# Patient Record
Sex: Male | Born: 1940 | Race: White | Hispanic: No | Marital: Married | Smoking: Former smoker
Health system: Southern US, Community
[De-identification: ages and names within clinical notes are randomized; demographics above are authoritative.]

## PROBLEM LIST (undated history)

## (undated) DIAGNOSIS — I739 Peripheral vascular disease, unspecified: Secondary | ICD-10-CM

## (undated) DIAGNOSIS — E7800 Pure hypercholesterolemia, unspecified: Secondary | ICD-10-CM

## (undated) DIAGNOSIS — M109 Gout, unspecified: Secondary | ICD-10-CM

## (undated) DIAGNOSIS — I10 Essential (primary) hypertension: Secondary | ICD-10-CM

## (undated) DIAGNOSIS — C8590 Non-Hodgkin lymphoma, unspecified, unspecified site: Secondary | ICD-10-CM

## (undated) DIAGNOSIS — M1990 Unspecified osteoarthritis, unspecified site: Secondary | ICD-10-CM

## (undated) HISTORY — PX: INGUINAL HERNIA REPAIR: SHX194

## (undated) HISTORY — PX: TONSILLECTOMY: SUR1361

## (undated) HISTORY — DX: Essential (primary) hypertension: I10

## (undated) HISTORY — PX: COLONOSCOPY: SHX174

## (undated) HISTORY — DX: Non-Hodgkin lymphoma, unspecified, unspecified site: C85.90

## (undated) HISTORY — DX: Gout, unspecified: M10.9

## (undated) HISTORY — DX: Unspecified osteoarthritis, unspecified site: M19.90

## (undated) HISTORY — DX: Peripheral vascular disease, unspecified: I73.9

## (undated) HISTORY — DX: Pure hypercholesterolemia, unspecified: E78.00

---

## 2018-01-25 ENCOUNTER — Ambulatory Visit: Payer: Medicare Other | Admitting: Cardiology

## 2018-03-22 ENCOUNTER — Encounter: Payer: Self-pay | Admitting: Gastroenterology

## 2018-03-22 ENCOUNTER — Other Ambulatory Visit (INDEPENDENT_AMBULATORY_CARE_PROVIDER_SITE_OTHER): Payer: Medicare Other

## 2018-03-22 ENCOUNTER — Telehealth: Payer: Self-pay

## 2018-03-22 ENCOUNTER — Ambulatory Visit (INDEPENDENT_AMBULATORY_CARE_PROVIDER_SITE_OTHER): Payer: Medicare Other | Admitting: Gastroenterology

## 2018-03-22 VITALS — BP 128/76 | HR 71 | Ht 67.0 in | Wt 176.5 lb

## 2018-03-22 DIAGNOSIS — K219 Gastro-esophageal reflux disease without esophagitis: Secondary | ICD-10-CM | POA: Diagnosis not present

## 2018-03-22 DIAGNOSIS — Z800 Family history of malignant neoplasm of digestive organs: Secondary | ICD-10-CM | POA: Diagnosis not present

## 2018-03-22 DIAGNOSIS — R9389 Abnormal findings on diagnostic imaging of other specified body structures: Secondary | ICD-10-CM

## 2018-03-22 DIAGNOSIS — R197 Diarrhea, unspecified: Secondary | ICD-10-CM

## 2018-03-22 NOTE — Progress Notes (Signed)
Chief Complaint:   Referring Provider:  Imagene Riches, NP      ASSESSMENT AND PLAN;   #1. Change in Bowel Habits (postprandial diarrhea) #2. FH of colon cancer (dad at age 32s). Neg colon 2010. #3. GERD. #4. H/O NHL at age 78. #5. Abn US showing cholelithiasis and borderline dilated CBD  Plan: - Check CBC, CMP and lipase today. - GI pathogen, WBC, fat and elastace - CT abdo/pelvis, may need MRCP if labs are abnormal or if CT shows any biliary dilatation. - I have also recommended EGD and colonoscopy.  However, patient and patient's wife would like to hold off at the present time.  They do understand that there is small but definite risks of missing neoplasms.   - If still with problems, will give him a trial of Bentyl. - Follow-up in 6 weeks once above blood tests are all back.  We will discuss more regarding endoscopic procedures at follow-up visit.    HPI:    Kenneth Pollard is a 78 y.o. male  Seen at request of Heide Scales Abnormal ultrasound showing cholelithiasis with mild CBD dilatation 7 mm.  No nausea, vomiting, heartburn (has been taking Nexium), regurgitation, odynophagia or dysphagia.  No significant diarrhea or constipation.  There is no melena or hematochezia. No unintentional weight loss.  No abdominal pain.  Has been having softer bowel movements/diarrhea ever since he moved to [place] from [place], [place] 6 months ago.  Has had colonoscopy over 10 years ago which was negative.  Had Cologuard 5 years ago which was negative per patient.  Diarrhea-softer bowel movements, associated abdominal bloating, has gained weight, could not identify any definite exacerbating factors, no intolerance to milk or cheese.  No jaundice dark urine or pale stools.  No history of itching.   We just got the lab results from [facility] office-from 01/27/2018 TB 1.6, direct 0.3, hemoglobin 17.8, platelets 172K AST 22, ALT 20, alk phos 47 albumin 4.7. Previously TSH normal,  HbA1c 5.7   Past Medical History:  Diagnosis Date  . Arthritis   . Elevated cholesterol   . Gout   . Hypertension   . Non Hodgkin's lymphoma ([place])   . PAD (peripheral artery disease) ([place])     Past Surgical History:  Procedure Laterality Date  . COLONOSCOPY     around 2010  . INGUINAL HERNIA REPAIR     at age 78  . TONSILLECTOMY      Family History  Problem Relation Age of Onset  . Colon cancer Father   . Esophageal cancer Neg Hx   . Prostate cancer Neg Hx     Social History   Tobacco Use  . Smoking status: Former Research scientist (life sciences)  . Smokeless tobacco: Never Used  Substance Use Topics  . Alcohol use: Yes    Comment: couple of times a week  . Drug use: Never    Current Outpatient Medications  Medication Sig Dispense Refill  . amLODipine (NORVASC) 5 MG tablet 1 tablet daily.    Marland Kitchen atorvastatin (LIPITOR) 10 MG tablet 1 tablet daily.    . benzonatate (TESSALON PERLES) 100 MG capsule Take 100 mg by mouth as needed for cough.    . cilostazol (PLETAL) 100 MG tablet 1 tablet 2 (two) times daily.    Marland Kitchen esomeprazole (NEXIUM) 40 MG capsule 1 capsule daily.    . fluticasone (FLONASE) 50 MCG/ACT nasal spray daily.    Marland Kitchen guaiFENesin (MUCINEX) 600 MG 12 hr tablet 1 tablet daily.    Marland Kitchen  hydrochlorothiazide (HYDRODIURIL) 25 MG tablet 1 tablet daily.    . meloxicam (MOBIC) 15 MG tablet 1 tablet daily.    . metoprolol (TOPROL XL) 200 MG 24 hr tablet 1 tablet daily.     No current facility-administered medications for this visit.     Allergies  Allergen Reactions  . Pneumococcal Vaccine Other (See Comments)  . Codeine Rash    Review of Systems:  Constitutional: Denies fever, chills, diaphoresis, appetite change and fatigue.  HEENT: Denies photophobia, eye pain, redness, hearing loss, ear pain, congestion, sore throat, rhinorrhea, sneezing, mouth sores, neck pain, neck stiffness and tinnitus.   Respiratory: Denies SOB, DOE, cough, chest tightness,  and wheezing.   Cardiovascular: Denies  chest pain, palpitations and leg swelling.  Genitourinary: Denies dysuria, urgency, frequency, hematuria, flank pain and difficulty urinating.  Musculoskeletal: Denies myalgias, back pain, joint swelling, arthralgias and gait problem.  Skin: No rash.  Neurological: Denies dizziness, seizures, syncope, weakness, light-headedness, numbness and headaches.  Hematological: Denies adenopathy. Easy bruising, personal or family bleeding history  Psychiatric/Behavioral: No anxiety or depression     Physical Exam:    Ht 5' 7" (1.702 m)   Wt 176 lb 8 oz (80.1 kg)   BMI 27.64 kg/m  Filed Weights   03/22/18 1442  Weight: 176 lb 8 oz (80.1 kg)   Constitutional:  Well-developed, in no acute distress. Psychiatric: Normal mood and affect. Behavior is normal. HEENT: Pupils normal.  Conjunctivae are normal. No scleral icterus. Neck supple.  Cardiovascular: Normal rate, regular rhythm. No edema Pulmonary/chest: Effort normal and breath sounds normal. No wheezing, rales or rhonchi. Abdominal: Soft, nondistended. Nontender. Bowel sounds active throughout. There are no masses palpable. No hepatomegaly. Rectal:  defered Neurological: Alert and oriented to person place and time. Skin: Skin is warm and dry. No rashes noted.    Carmell Austria, MD 03/22/2018, 3:04 PM  Cc: Imagene Riches, NP

## 2018-03-22 NOTE — Telephone Encounter (Signed)
Called and left voicemail on patient's home number to give [place] a call to set up his 6 week follow up appointment with Dr Lyndel Safe in March. Left our number as [phone] and told him to call in call in march to set appointment up and if he had questions then to call us

## 2018-03-22 NOTE — Patient Instructions (Addendum)
If you are age 78 or older, your body mass index should be between 23-30. Your Body mass index is 27.64 kg/m. If this is out of the aforementioned range listed, please consider follow up with your Primary Care Provider.  If you are age 85 or younger, your body mass index should be between 19-25. Your Body mass index is 27.64 kg/m. If this is out of the aformentioned range listed, please consider follow up with your Primary Care Provider.   Call us back at [phone] if you decide you would like to proceed with you Endoscopy and Colonoscopy.  Please go to the lab on the 2nd floor [address] before you leave the office today.   You [facility] CT [facility] [place] [place] [address] [address]   You are scheduled on 03/28/2018 at 10:30am. You should arrive 15 minutes prior to your appointment time for registration. Please follow the written instructions below on the day of your exam:  WARNING: IF YOU ARE ALLERGIC TO IODINE/X-RAY DYE, PLEASE NOTIFY RADIOLOGY IMMEDIATELY AT [phone]! YOU WILL BE GIVEN A 13 HOUR PREMEDICATION PREP.  1) Do not eat or drink anything after 6:30am (4 hours prior to your test) 2) You have been given 2 bottles of oral contrast to drink. The solution may taste better if refrigerated, but do NOT add ice or any other liquid to this solution. Shake well before drinking.    Drink 1 bottle of contrast @ 8:30am (2 hours prior to your exam)  Drink 1 bottle of contrast @ 9:30am (1 hour prior to your exam)  You may take any medications as prescribed with a small amount of water, if necessary. If you take any of the following medications: METFORMIN, GLUCOPHAGE, GLUCOVANCE, AVANDAMET, RIOMET, FORTAMET, [place] MET, JANUMET, GLUMETZA or METAGLIP, you MAY be asked to HOLD this medication 48 hours AFTER the exam.  The purpose of you drinking the oral contrast is to aid in the visualization  of your intestinal tract. The contrast solution may cause some diarrhea. Depending on your individual set of symptoms, you may also receive an intravenous injection of x-ray contrast/dye. Plan on being at [facility] for 30 minutes or longer, depending on the type of exam you are having performed.  This test typically takes 30-45 minutes to complete.  If you have any questions regarding your exam or if you need to reschedule, you may call the CT department at [phone] between the hours of 8:00 am and 5:00 pm, Monday-Friday.  ________________________________________________________________________   Thank you,  Dr. Jackquline Denmark

## 2018-03-23 LAB — CBC WITH DIFFERENTIAL/PLATELET
Basophils Absolute: 0.1 10*3/uL (ref 0.0–0.1)
Basophils Relative: 0.8 % (ref 0.0–3.0)
Eosinophils Absolute: 0.3 10*3/uL (ref 0.0–0.7)
Eosinophils Relative: 3.1 % (ref 0.0–5.0)
HCT: 49.7 % (ref 39.0–52.0)
Hemoglobin: 17.2 g/dL — ABNORMAL HIGH (ref 13.0–17.0)
Lymphocytes Relative: 20.9 % (ref 12.0–46.0)
Lymphs Abs: 1.9 10*3/uL (ref 0.7–4.0)
MCHC: 34.7 g/dL (ref 30.0–36.0)
MCV: 87.4 fl (ref 78.0–100.0)
Monocytes Absolute: 1.1 10*3/uL — ABNORMAL HIGH (ref 0.1–1.0)
Monocytes Relative: 12.1 % — ABNORMAL HIGH (ref 3.0–12.0)
Neutro Abs: 5.9 10*3/uL (ref 1.4–7.7)
Neutrophils Relative %: 63.1 % (ref 43.0–77.0)
Platelets: 176 10*3/uL (ref 150.0–400.0)
RBC: 5.68 Mil/uL (ref 4.22–5.81)
RDW: 14.8 % (ref 11.5–15.5)
WBC: 9.3 10*3/uL (ref 4.0–10.5)

## 2018-03-23 LAB — COMPREHENSIVE METABOLIC PANEL
ALK PHOS: 46 U/L (ref 39–117)
ALT: 19 U/L (ref 0–53)
AST: 22 U/L (ref 0–37)
Albumin: 4.4 g/dL (ref 3.5–5.2)
BUN: 18 mg/dL (ref 6–23)
CO2: 28 mEq/L (ref 19–32)
Calcium: 9.9 mg/dL (ref 8.4–10.5)
Chloride: 98 mEq/L (ref 96–112)
Creatinine, Ser: 1.42 mg/dL (ref 0.40–1.50)
GFR: 51.28 mL/min — ABNORMAL LOW (ref >60.00)
Glucose, Bld: 95 mg/dL (ref 70–99)
POTASSIUM: 4.5 meq/L (ref 3.5–5.1)
Sodium: 135 mEq/L (ref 135–145)
TOTAL PROTEIN: 6.7 g/dL (ref 6.0–8.3)
Total Bilirubin: 1.3 mg/dL — ABNORMAL HIGH (ref 0.2–1.2)

## 2018-03-23 LAB — LIPASE: Lipase: 19 U/L (ref 11.0–59.0)

## 2018-03-28 ENCOUNTER — Other Ambulatory Visit: Payer: Medicare Other

## 2018-03-28 ENCOUNTER — Ambulatory Visit (HOSPITAL_BASED_OUTPATIENT_CLINIC_OR_DEPARTMENT_OTHER)
Admission: RE | Admit: 2018-03-28 | Discharge: 2018-03-28 | Disposition: A | Discharge status: Home / Self Care | Payer: Medicare Other | Source: Ambulatory Visit | Attending: Gastroenterology | Admitting: Gastroenterology

## 2018-03-28 DIAGNOSIS — R9389 Abnormal findings on diagnostic imaging of other specified body structures: Secondary | ICD-10-CM

## 2018-03-28 DIAGNOSIS — K219 Gastro-esophageal reflux disease without esophagitis: Secondary | ICD-10-CM | POA: Diagnosis present

## 2018-03-28 DIAGNOSIS — R197 Diarrhea, unspecified: Secondary | ICD-10-CM | POA: Diagnosis present

## 2018-03-28 DIAGNOSIS — Z800 Family history of malignant neoplasm of digestive organs: Secondary | ICD-10-CM

## 2018-03-28 MED ORDER — IOPAMIDOL (ISOVUE-300) INJECTION 61%
100.0000 mL | Freq: Once | INTRAVENOUS | Status: AC | PRN
  Administered 2018-03-28: 100 mL via INTRAVENOUS

## 2018-03-29 LAB — FECAL LACTOFERRIN, QUANT
FECAL LACTOFERRIN: POSITIVE — AB
MICRO NUMBER:: 77926
SPECIMEN QUALITY:: ADEQUATE

## 2018-03-30 ENCOUNTER — Other Ambulatory Visit (HOSPITAL_BASED_OUTPATIENT_CLINIC_OR_DEPARTMENT_OTHER): Payer: PRIVATE HEALTH INSURANCE

## 2018-03-30 ENCOUNTER — Telehealth: Payer: Self-pay | Admitting: Gastroenterology

## 2018-03-30 LAB — FECAL FAT, QUALITATIVE
Fat Qual Neutral, Stl: NORMAL
Fat Qual Total, Stl: NORMAL

## 2018-03-30 NOTE — Telephone Encounter (Signed)
Please see previous documentation for additional information (duplicate entry)

## 2018-03-31 ENCOUNTER — Encounter: Payer: PRIVATE HEALTH INSURANCE | Admitting: Gastroenterology

## 2018-03-31 LAB — GASTROINTESTINAL PATHOGEN PANEL PCR
C. difficile Tox A/B, PCR: NOT DETECTED
Campylobacter, PCR: NOT DETECTED
Cryptosporidium, PCR: NOT DETECTED
E COLI (ETEC) LT/ST, PCR: NOT DETECTED
E coli (STEC) stx1/stx2, PCR: NOT DETECTED
E coli 0157, PCR: NOT DETECTED
Giardia lamblia, PCR: NOT DETECTED
Norovirus, PCR: NOT DETECTED
Rotavirus A, PCR: NOT DETECTED
Salmonella, PCR: NOT DETECTED
Shigella, PCR: NOT DETECTED

## 2018-04-05 LAB — PANCREATIC ELASTASE, FECAL

## 2018-05-27 ENCOUNTER — Ambulatory Visit: Payer: PRIVATE HEALTH INSURANCE | Admitting: Gastroenterology

## 2019-12-12 IMAGING — CT CT ABD-PELV W/ CM
2 of 5 series · 16 of 46 positions shown, 18 images · IV contrast (APPLIED)
Comparison: Abdomen ultrasound on 02/23/2018

CLINICAL DATA: Diffuse abdominal pain and tenderness for several
weeks. Diarrhea. Biliary ductal dilatation on recent ultrasound.
Personal history of non-Hodgkin lymphoma.

EXAM:
CT ABDOMEN AND PELVIS WITH CONTRAST
TECHNIQUE: Multidetector CT imaging of the abdomen and pelvis was performed
using the standard protocol following bolus administration of
intravenous contrast.
CONTRAST:  100mL ODWNLX-I77 IOPAMIDOL (ODWNLX-I77) INJECTION 61%

[Series 2: axial st · axial · 0.88mm/px · z∈[-662,-172]mm · 13 of 112 slices shown, 15 images]
[im 7/112  soft-tissue]
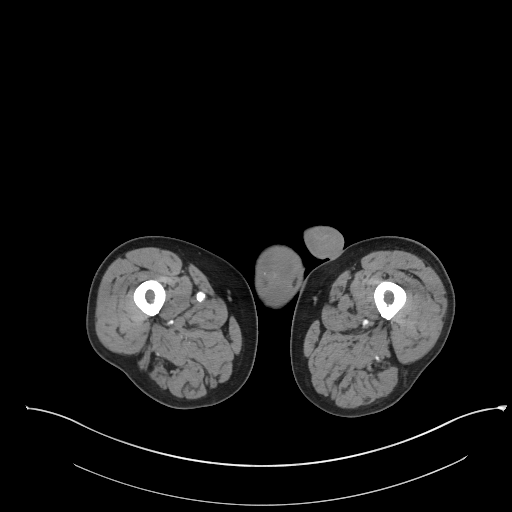
[im 7/112  bone]
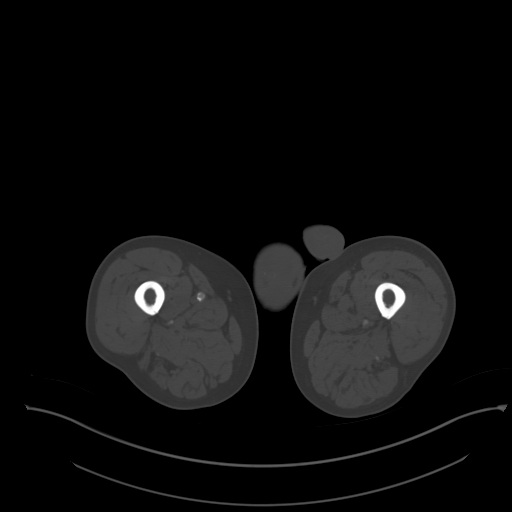
[im 13/112  soft-tissue]
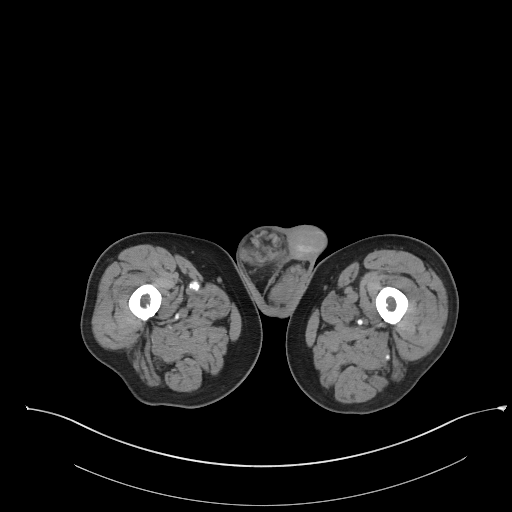
[im 25/112  soft-tissue]
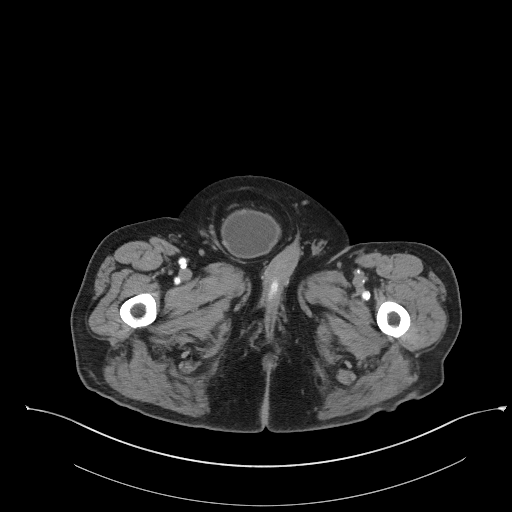
[im 31/112  soft-tissue]
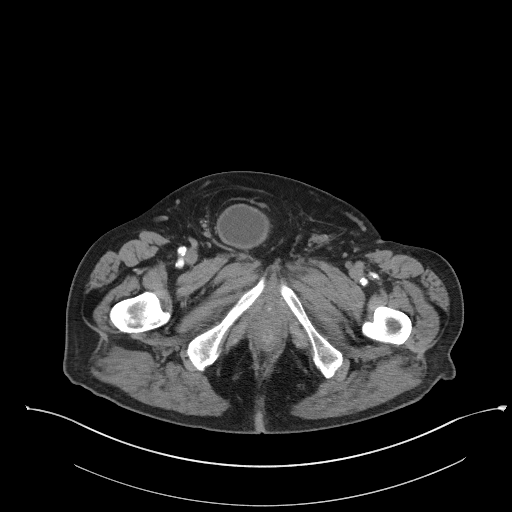
[im 38/112  soft-tissue]
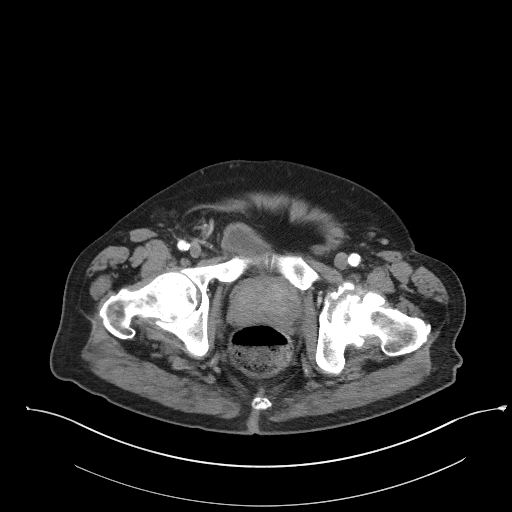
[im 50/112  soft-tissue]
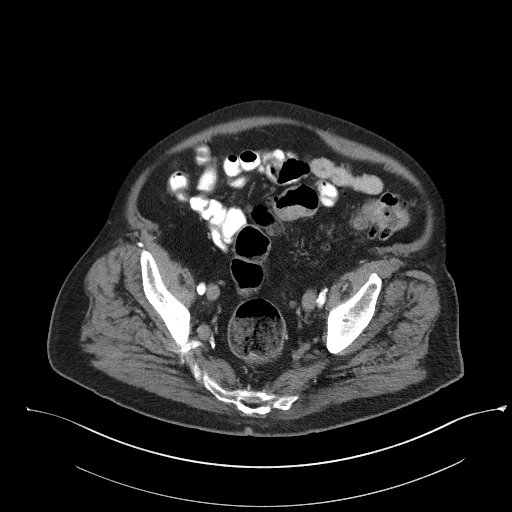
[im 56/112  soft-tissue]
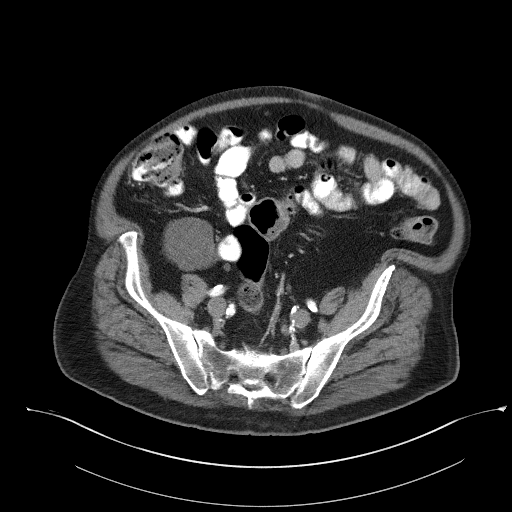
[im 62/112  soft-tissue]
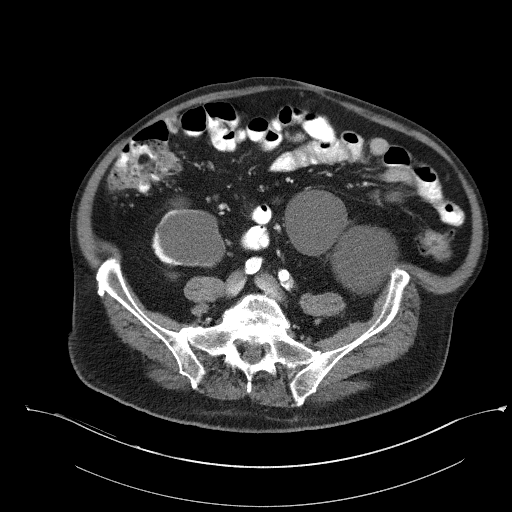
[im 75/112  soft-tissue]
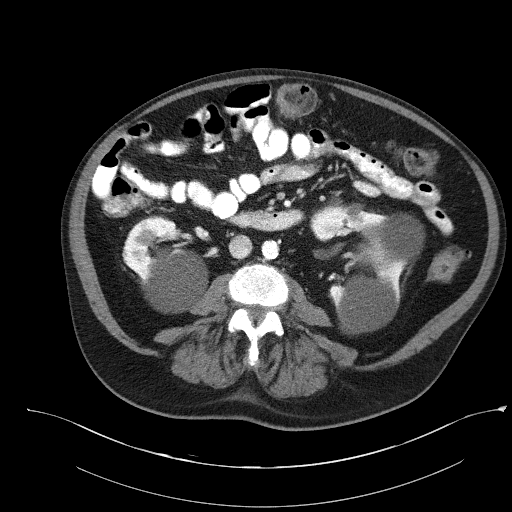
[im 75/112  bone]
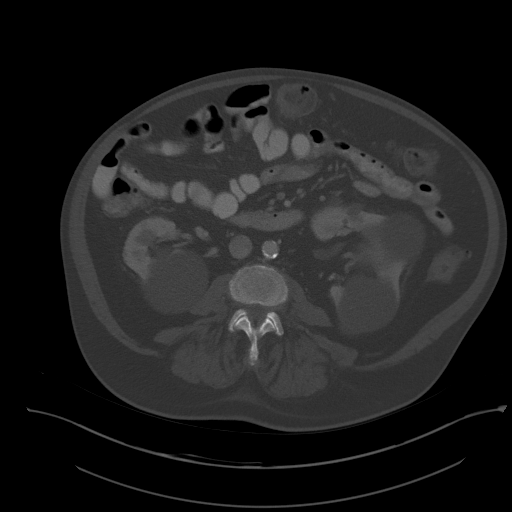
[im 81/112  soft-tissue]
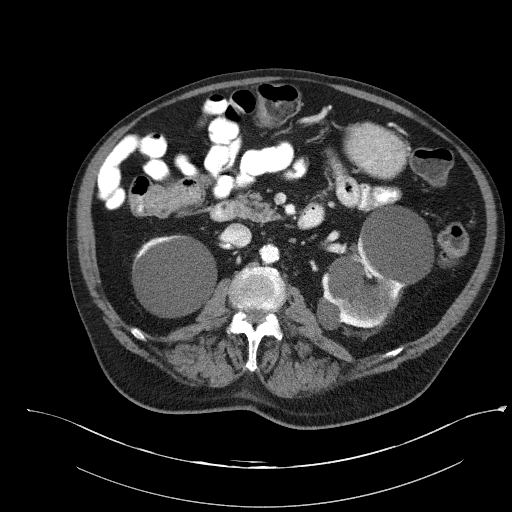
[im 87/112  soft-tissue]
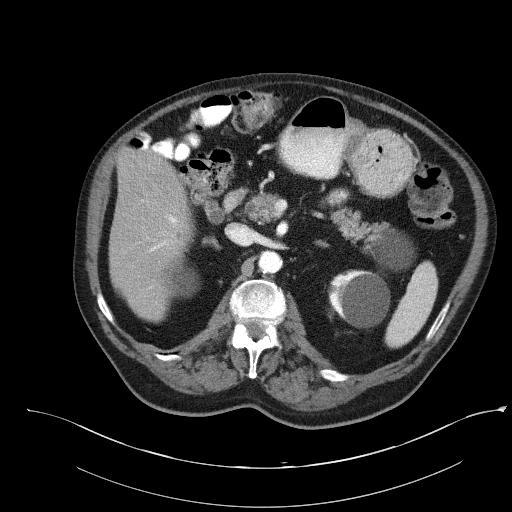
[im 99/112  soft-tissue]
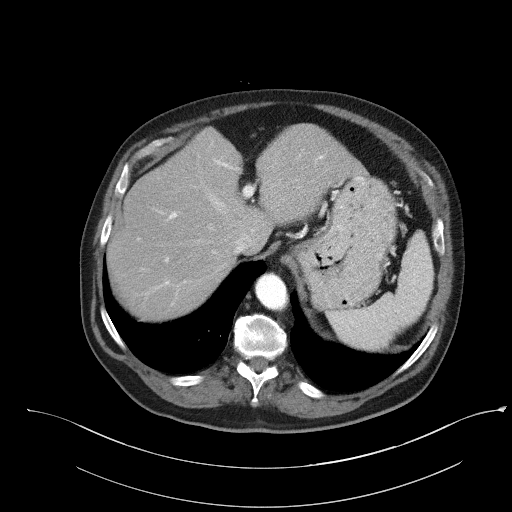
[im 105/112  soft-tissue]
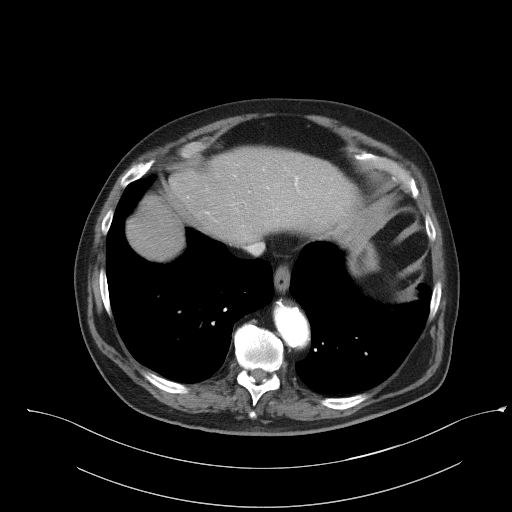

[Series 4: coronal st · coronal · 0.95mm/px · 3 of 110 slices shown]
[im 37/110  soft-tissue]
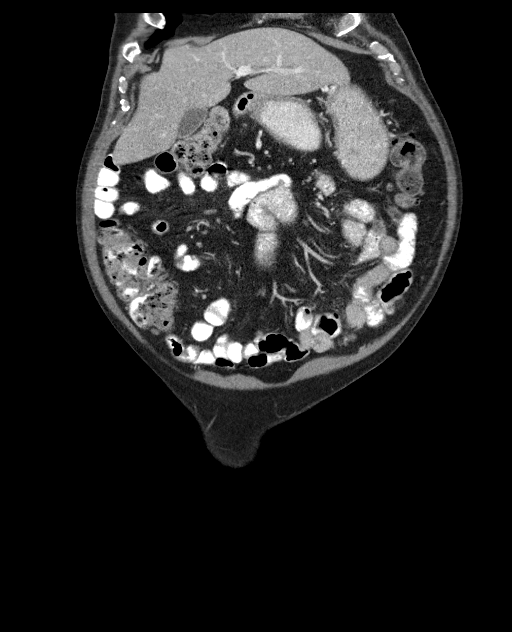
[im 49/110  soft-tissue]
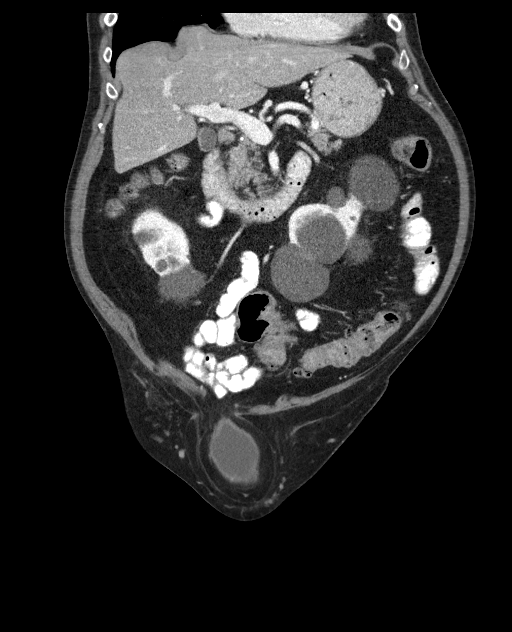
[im 61/110  soft-tissue]
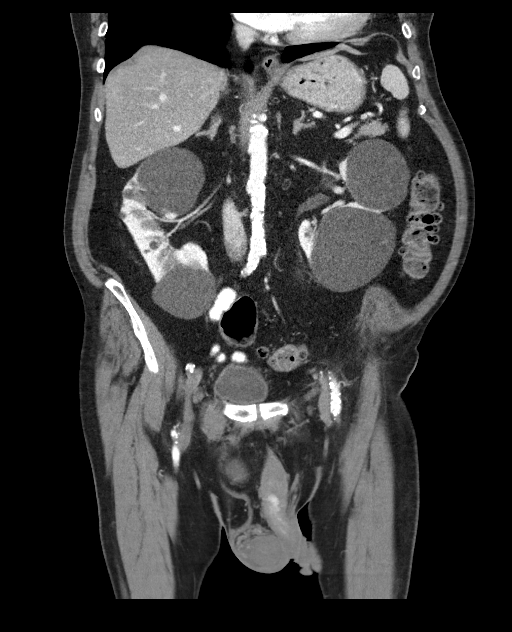

[16 of 46 positions shown; findings below may reference images not displayed]

FINDINGS: Lower Chest: No acute findings.

Hepatobiliary: No hepatic masses identified. Mild diffuse hepatic
steatosis. Gallbladder is unremarkable in appearance. Extrahepatic
common bile duct measures 11 mm, however there is no evidence of
intrahepatic ductal dilatation. No obstructing etiology visualized
by CT.

Pancreas: No mass or inflammatory changes. No evidence of pancreatic
ductal dilatation.

Spleen: Within normal limits in size and appearance.

Adrenals/Urinary Tract: Normal adrenal glands. Multiple bilateral
renal cysts are seen. There is no evidence of renal mass or
hydronephrosis. The urinary bladder extends into a large right
inguinal hernia.

Stomach/Bowel: No evidence of obstruction, inflammatory process or
abnormal fluid collections. Diverticulosis is seen mainly involving
the descending and sigmoid colon, however there is no evidence of
diverticulitis.

Vascular/Lymphatic: No pathologically enlarged lymph nodes. No
abdominal aortic aneurysm. Aortic atherosclerosis.

Reproductive: Mildly enlarged prostate gland. Symmetric seminal
vesicles.

Other:  None.

Musculoskeletal: No suspicious bone lesions identified. Severe left
hip joint osteoarthritis incidentally
IMPRESSION: Mild dilatation of extrahepatic common bile duct, without
intrahepatic ductal dilatation. No obstructing etiology visualized
by CT, although CT cannot exclude choledocholithiasis. Consider
abdomen MRI and MRCP without and with contrast for further
evaluation if clinically warranted.

Mild hepatic steatosis.

Colonic diverticulosis, without radiographic evidence of
diverticulitis.

Mildly enlarged prostate.

Large right inguinal hernia containing a significant portion of the
urinary bladder.
